# Patient Record
Sex: Male | Born: 2000 | Hispanic: No | Marital: Single | Smoking: Never smoker
Health system: Southern US, Community
[De-identification: ages and names within clinical notes are randomized; demographics above are authoritative.]

## PROBLEM LIST (undated history)

## (undated) DIAGNOSIS — J45909 Unspecified asthma, uncomplicated: Secondary | ICD-10-CM

## (undated) HISTORY — PX: APPENDECTOMY: SHX54

---
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
## 2020-03-10 ENCOUNTER — Encounter (HOSPITAL_COMMUNITY): Payer: Self-pay | Admitting: Emergency Medicine

## 2020-03-10 ENCOUNTER — Other Ambulatory Visit: Payer: Self-pay

## 2020-03-10 ENCOUNTER — Emergency Department (HOSPITAL_COMMUNITY)
Admission: EM | Admit: 2020-03-10 | Discharge: 2020-03-10 | Disposition: A | Discharge status: Home / Self Care | Payer: No Typology Code available for payment source | Attending: Emergency Medicine | Admitting: Emergency Medicine

## 2020-03-10 DIAGNOSIS — Z23 Encounter for immunization: Secondary | ICD-10-CM | POA: Insufficient documentation

## 2020-03-10 DIAGNOSIS — W268XXA Contact with other sharp object(s), not elsewhere classified, initial encounter: Secondary | ICD-10-CM | POA: Insufficient documentation

## 2020-03-10 DIAGNOSIS — S41111A Laceration without foreign body of right upper arm, initial encounter: Secondary | ICD-10-CM

## 2020-03-10 DIAGNOSIS — Y990 Civilian activity done for income or pay: Secondary | ICD-10-CM | POA: Diagnosis not present

## 2020-03-10 DIAGNOSIS — J45909 Unspecified asthma, uncomplicated: Secondary | ICD-10-CM | POA: Insufficient documentation

## 2020-03-10 DIAGNOSIS — S51811A Laceration without foreign body of right forearm, initial encounter: Secondary | ICD-10-CM | POA: Insufficient documentation

## 2020-03-10 MED ORDER — TETANUS-DIPHTH-ACELL PERTUSSIS 5-2.5-18.5 LF-MCG/0.5 IM SUSY
0.5000 mL | PREFILLED_SYRINGE | Freq: Once | INTRAMUSCULAR | Status: AC
  Administered 2020-03-10: 0.5 mL via INTRAMUSCULAR
  Filled 2020-03-10: qty 0.5

## 2020-03-10 NOTE — ED Provider Notes (Signed)
[facility] EMERGENCY DEPARTMENT Provider Note   CSN: [id number] Arrival date & time: 03/10/20  1503     History Chief Complaint  Patient presents with  . Laceration    Brett Morales is a 20 y.o. male.  HPI 20 year old male with a history of asthma presents to the ER with a laceration to his right arm.  Patient states that he was working in the kitchen, and sliced his arm on a metal shelf.  He did have some significant bleeding.  Bleeding is controlled here in the ED.  Denies any numbness or tingling.  He was told by his boss to come get checked out.    Past Medical History:  Diagnosis Date  . Asthma     Patient Active Problem List   Diagnosis Date Noted  . Asthma 05/17/2015    Past Surgical History:  Procedure Laterality Date  . APPENDECTOMY         Family History  Problem Relation Age of Onset  . Depression Mother   . Hyperlipidemia Father   . Diabetes Maternal Grandmother     Social History   Tobacco Use  . Smoking status: Never Smoker  . Smokeless tobacco: Never Used  Substance Use Topics  . Alcohol use: No  . Drug use: No    Home Medications Prior to Admission medications   Medication Sig Start Date End Date Taking? Authorizing Provider  albuterol (PROVENTIL HFA;VENTOLIN HFA) 108 (90 Base) MCG/ACT inhaler Inhale 1-2 puffs into the lungs every 6 (six) hours as needed for wheezing or shortness of breath. Patient not taking: Reported on 05/17/2015 03/16/15   Palumbo, April, MD  ibuprofen (ADVIL,MOTRIN) 800 MG tablet Take 1 tablet (800 mg total) by mouth 3 (three) times daily. 03/27/18   Roxy Horseman, PA-C    Allergies    Patient has no known allergies.  Review of Systems   Review of Systems  Constitutional: Negative for fever.  Skin: Positive for color change and wound.  Neurological: Negative for weakness and numbness.    Physical Exam Updated Vital Signs BP (!) 147/83 (BP Location: Left Arm)   Pulse 100   Temp  98.8 F (37.1 C) (Oral)   Resp 16   SpO2 98%   Physical Exam Vitals and nursing note reviewed.  Constitutional:      Appearance: He is well-developed and well-nourished. He is not ill-appearing, toxic-appearing or diaphoretic.  HENT:     Head: Normocephalic and atraumatic.  Eyes:     Conjunctiva/sclera: Conjunctivae normal.  Cardiovascular:     Rate and Rhythm: Normal rate and regular rhythm.     Heart sounds: No murmur heard.   Pulmonary:     Effort: Pulmonary effort is normal. No respiratory distress.     Breath sounds: Normal breath sounds.  Abdominal:     Palpations: Abdomen is soft.     Tenderness: There is no abdominal tenderness.  Musculoskeletal:        General: No swelling, tenderness or edema.     Cervical back: Neck supple.  Skin:    General: Skin is warm.     Findings: Erythema and lesion present.     Comments: Superficial 2 to 3 cm laceration to the right posterior forearm.  Bleeding controlled.  Finger 5 digits without difficulty, neurovascularly intact.  Neurological:     General: No focal deficit present.     Mental Status: He is alert.  Psychiatric:        Mood and Affect: Mood  and affect and mood normal.        Behavior: Behavior normal.       ED Results / Procedures / Treatments   Labs (all labs ordered are listed, but only abnormal results are displayed) Labs Reviewed - No data to display  EKG None  Radiology No results found.  Procedures Procedures   Medications Ordered in ED Medications  Tdap (BOOSTRIX) injection 0.5 mL (has no administration in time range)    ED Course  I have reviewed the triage vital signs and the nursing notes.  Pertinent labs & imaging results that were available during my care of the patient were reviewed by me and considered in my medical decision making (see chart for details).    MDM Rules/Calculators/A&P                         20 year old male who presents to the ER with a laceration to the right  posterior forearm.  Bleeding controlled here in the ER.  Wound is very superficial, does not warrant closure.  Bleeding is controlled.  No signs of infection.  Low suspicion for foreign body.  Patient was encouraged to clean the area, apply topical antibiotic cream if needed.  We discussed return precautions and signs of infection.  Tetanus will be updated today.  Will refer to community health and wellness given the patient is uninsured.  He was understanding and is agreeable.  This stage in the ED course, the patient is medically screened and stable for discharge Final Clinical Impression(s) / ED Diagnoses Final diagnoses:  Laceration of right upper extremity, initial encounter    Rx / DC Orders ED Discharge Orders    None       Leone Brand 03/10/20 1634    Tegeler, Canary Brim, MD 03/10/20 [phone]

## 2020-03-10 NOTE — Discharge Instructions (Addendum)
Your wound was superficial today, and as discussed does not warrant any stitches.  Please keep the area clean and dry, you may apply gauze, Coban wrap, clean with soap and water, apply topical antibiotic cream.  Please return to the ER seek medical help if you notice worsening redness, swelling, drainage, fevers, worsening pain etc.

## 2020-03-10 NOTE — ED Triage Notes (Signed)
Patient here after cutting right forearm on a metal shelf in refrigerator at work. Hemorrhage controlled. Unsure of last tetanus shot.

## 2020-03-11 ENCOUNTER — Telehealth: Payer: Self-pay | Admitting: Family Medicine

## 2020-03-11 NOTE — Telephone Encounter (Signed)
 [facility] last saw this patient in 2017, >5 yrs ago.  I am listed as his PCP and have received his ED visit report but never met him. I called to check on him and schedule a new patient appointment with me, but he declined.  Since he had not been seen for over 3 yrs, I advised that he is technically no longer a patient here. However, he can call back to reestablish care with [place] when he is ready. He agreed with the plan.

## 2020-05-27 ENCOUNTER — Other Ambulatory Visit: Payer: Self-pay

## 2020-05-27 ENCOUNTER — Emergency Department (HOSPITAL_COMMUNITY): Payer: No Typology Code available for payment source

## 2020-05-27 ENCOUNTER — Encounter (HOSPITAL_COMMUNITY): Payer: Self-pay | Admitting: Emergency Medicine

## 2020-05-27 ENCOUNTER — Emergency Department (HOSPITAL_COMMUNITY)
Admission: EM | Admit: 2020-05-27 | Discharge: 2020-05-27 | Disposition: A | Discharge status: Home / Self Care | Payer: No Typology Code available for payment source | Attending: Emergency Medicine | Admitting: Emergency Medicine

## 2020-05-27 DIAGNOSIS — J45909 Unspecified asthma, uncomplicated: Secondary | ICD-10-CM | POA: Insufficient documentation

## 2020-05-27 DIAGNOSIS — Y92410 Unspecified street and highway as the place of occurrence of the external cause: Secondary | ICD-10-CM | POA: Diagnosis not present

## 2020-05-27 DIAGNOSIS — S060X0A Concussion without loss of consciousness, initial encounter: Secondary | ICD-10-CM | POA: Diagnosis not present

## 2020-05-27 DIAGNOSIS — S0990XA Unspecified injury of head, initial encounter: Secondary | ICD-10-CM | POA: Diagnosis present
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
## 2023-08-17 ENCOUNTER — Encounter: Payer: Self-pay | Admitting: Advanced Practice Midwife
